# Patient Record
Sex: Female | Born: 1955 | Race: White | Hispanic: No | Marital: Married | Smoking: Current every day smoker
Health system: Southern US, Community
[De-identification: ages and names within clinical notes are randomized; demographics above are authoritative.]

## PROBLEM LIST (undated history)

## (undated) HISTORY — PX: NECK SURGERY: SHX720

## (undated) HISTORY — PX: ABDOMINAL HYSTERECTOMY: SHX81

---

## 2016-04-06 ENCOUNTER — Observation Stay (HOSPITAL_COMMUNITY)
Admission: EM | Admit: 2016-04-06 | Discharge: 2016-04-07 | Disposition: A | Discharge status: Home / Self Care | Payer: Self-pay | Attending: Internal Medicine | Admitting: Internal Medicine

## 2016-04-06 ENCOUNTER — Encounter (HOSPITAL_COMMUNITY): Payer: Self-pay | Admitting: Emergency Medicine

## 2016-04-06 ENCOUNTER — Emergency Department (HOSPITAL_COMMUNITY): Payer: Self-pay

## 2016-04-06 DIAGNOSIS — Z7982 Long term (current) use of aspirin: Secondary | ICD-10-CM | POA: Insufficient documentation

## 2016-04-06 DIAGNOSIS — F17200 Nicotine dependence, unspecified, uncomplicated: Secondary | ICD-10-CM | POA: Insufficient documentation

## 2016-04-06 DIAGNOSIS — R030 Elevated blood-pressure reading, without diagnosis of hypertension: Secondary | ICD-10-CM | POA: Insufficient documentation

## 2016-04-06 DIAGNOSIS — Z8249 Family history of ischemic heart disease and other diseases of the circulatory system: Secondary | ICD-10-CM | POA: Insufficient documentation

## 2016-04-06 DIAGNOSIS — R079 Chest pain, unspecified: Principal | ICD-10-CM | POA: Diagnosis present

## 2016-04-06 LAB — BASIC METABOLIC PANEL
Anion gap: 7 (ref 5–15)
BUN: 16 mg/dL (ref 6–20)
CALCIUM: 10.8 mg/dL — AB (ref 8.9–10.3)
CHLORIDE: 107 mmol/L (ref 101–111)
CO2: 27 mmol/L (ref 22–32)
CREATININE: 0.83 mg/dL (ref 0.44–1.00)
Glucose, Bld: 120 mg/dL — ABNORMAL HIGH (ref 65–99)
Potassium: 3.9 mmol/L (ref 3.5–5.1)
SODIUM: 141 mmol/L (ref 135–145)

## 2016-04-06 LAB — CBC
HCT: 38.2 % (ref 36.0–46.0)
Hemoglobin: 12.4 g/dL (ref 12.0–15.0)
MCH: 30.9 pg (ref 26.0–34.0)
MCHC: 32.5 g/dL (ref 30.0–36.0)
MCV: 95.3 fL (ref 78.0–100.0)
PLATELETS: 286 10*3/uL (ref 150–400)
RBC: 4.01 MIL/uL (ref 3.87–5.11)
RDW: 13.9 % (ref 11.5–15.5)
WBC: 8.1 10*3/uL (ref 4.0–10.5)

## 2016-04-06 LAB — I-STAT TROPONIN, ED: TROPONIN I, POC: 0 ng/mL (ref 0.00–0.08)

## 2016-04-06 MED ORDER — ASPIRIN 81 MG PO CHEW
162.0000 mg | CHEWABLE_TABLET | Freq: Once | ORAL | Status: AC
  Administered 2016-04-07: 162 mg via ORAL
  Filled 2016-04-06: qty 2

## 2016-04-06 MED ORDER — NITROGLYCERIN 0.4 MG SL SUBL
0.4000 mg | SUBLINGUAL_TABLET | SUBLINGUAL | Status: DC | PRN
  Administered 2016-04-07: 0.4 mg via SUBLINGUAL
  Filled 2016-04-06: qty 1

## 2016-04-06 NOTE — ED Triage Notes (Signed)
Pt states she had a sudden onset of chest pain about 20 minutes ago  Pt states the pain started in her left chest and went up to her left shoulder and down into her left arm  Pt states the pain is a steady 7-8 with spikes higher to a 10  Pt states feels light headed  Denies N/V  Pt states she took 2 baby aspirin prior to arrival  Pt states she was watching TV when the pain started

## 2016-04-06 NOTE — ED Provider Notes (Signed)
[facility]-EMERGENCY DEPT Provider Note   CSN: [id number] Arrival date & time: 04/06/16  2208  First Provider Contact:  None    By signing my name below, I, Majel Homer, attest that this documentation has been prepared under the direction and in the presence of Dione Booze, MD . Electronically Signed: Majel Homer, Scribe. 04/07/2016. 12:08 AM.  History   Chief Complaint Chief Complaint  Patient presents with  . Chest Pain   HPI Comments: Amber Hoover is a 60 y.o. female with PMHx of HLD, who presents to the Emergency Department complaining of sudden onset, 5/10, central chest pain that began a few hours PTA. Pt reports her pain radiates down her bilateral arms and left shoulder; she notes she is still experiencing pain in her left shoulder now in the ED. She states her pain lasted for ~30 minutes before spontaneously improving. Pt reports she was sitting down watching tv at the onset of her pain. She states this is the first time she has experienced these symptoms. She notes she took 2 baby aspirin before visiting the ED with mild relief. Pt reports she came to the ED because she was worried by the sudden onset of her pain. She denies shortness of breath, diaphoresis, nausea, vomiting and hx of DM. She reports she smokes ~1 pack a day. Pt notes FHx of heart problems in which her father had 20 MIs at 10 years old and her grandfather died from a MI at 74 y.o.    The history is provided by the patient. No language interpreter was used.   History reviewed. No pertinent past medical history.  There are no active problems to display for this patient.  Past Surgical History:  Procedure Laterality Date  . ABDOMINAL HYSTERECTOMY    . NECK SURGERY      OB History    No data available     Home Medications    Prior to Admission medications   Medication Sig Start Date End Date Taking? Authorizing Provider  aspirin EC 81 MG tablet Take 162 mg by mouth daily as needed (for chest pain).   Yes Historical  Provider, MD  ranitidine (ZANTAC) 150 MG tablet Take 150 mg by mouth 2 (two) times daily as needed for heartburn.   Yes Historical Provider, MD    Family History Family History  Problem Relation Age of Onset  . Diabetes Other   . Cancer Other   . Hypertension Other   . Heart attack Other     Social History Social History  Substance Use Topics  . Smoking status: Current Every Day Smoker  . Smokeless tobacco: Never Used  . Alcohol use No     Comment: former      Allergies   Review of patient's allergies indicates no known allergies.   Review of Systems Review of Systems  Constitutional: Negative for diaphoresis.  Respiratory: Negative for shortness of breath.   Cardiovascular: Positive for chest pain.  Gastrointestinal: Negative for nausea and vomiting.  Musculoskeletal: Positive for arthralgias (left shoulder).   Physical Exam Updated Vital Signs BP 172/94 (BP Location: Left Arm)   Pulse 84   Temp 98 F (36.7 C) (Oral)   Resp 20   SpO2 95%   Physical Exam  Constitutional: She is oriented to person, place, and time. She appears well-developed and well-nourished.  HENT:  Head: Normocephalic and atraumatic.  Eyes: Conjunctivae and EOM are normal. Pupils are equal, round, and reactive to light. Right eye exhibits no discharge. Left eye  exhibits no discharge. No scleral icterus.  Neck: Normal range of motion. Neck supple. No JVD present.  Cardiovascular: Normal rate, regular rhythm and normal heart sounds.   No murmur heard. Pulmonary/Chest: Effort normal and breath sounds normal. She has no wheezes. She has no rales. She exhibits no tenderness.  Abdominal: Soft. Bowel sounds are normal. She exhibits no distension and no mass. There is no tenderness.  Musculoskeletal: Normal range of motion. She exhibits no edema.  Lymphadenopathy:    She has no cervical adenopathy.  Neurological: She is alert and oriented to person, place, and time. No cranial nerve deficit. She  exhibits normal muscle tone. Coordination normal.  Skin: Skin is warm. No rash noted.  Psychiatric: She has a normal mood and affect. Her behavior is normal. Judgment and thought content normal.  Nursing note and vitals reviewed.  ED Treatments / Results  Labs (all labs ordered are listed, but only abnormal results are displayed) Labs Reviewed  BASIC METABOLIC PANEL - Abnormal; Notable for the following:       Result Value   Glucose, Bld 120 (*)    Calcium 10.8 (*)    All other components within normal limits  CBC  I-STAT TROPOININ, ED    EKG  EKG Interpretation  Date/Time:  Wednesday April 06 2016 22:15:51 EDT Ventricular Rate:  84 PR Interval:    QRS Duration: 97 QT Interval:  372 QTC Calculation: 440 R Axis:   8 Text Interpretation:  Sinus rhythm Normal ECG No old tracing to compare Confirmed by [facility]  MD, Aerial Dilley ([id number]) on 04/06/2016 11:48:26 PM      Radiology Dg Chest 2 View  Result Date: 04/06/2016 CLINICAL DATA:  Left-sided chest pain EXAM: CHEST  2 VIEW COMPARISON:  None. FINDINGS: The heart size and mediastinal contours are within normal limits. Both lungs are clear. Granuloma is noted in the left mid lung. The visualized skeletal structures are unremarkable. IMPRESSION: No active cardiopulmonary disease. Electronically Signed   By: Alcide Clever M.D.   On: 04/06/2016 22:46   Procedures Procedures  DIAGNOSTIC STUDIES:  Oxygen Saturation is 95% on RA, normal by my interpretation.    COORDINATION OF CARE:  11:59 PM Discussed treatment plan, which includes aspirin and NTG with pt at bedside and pt agreed to plan.   Medications Ordered in ED Medications  nitroGLYCERIN (NITROSTAT) SL tablet 0.4 mg (0.4 mg Sublingual Given 04/07/16 0113)  aspirin chewable tablet 162 mg (162 mg Oral Given 04/07/16 0013)   Initial Impression / Assessment and Plan / ED Course  I have reviewed the triage vital signs and the nursing notes.  Pertinent labs & imaging results that were  available during my care of the patient were reviewed by me and considered in my medical decision making (see chart for details).  Clinical Course    Chest discomfort which is worrisome for cardiac event. Patient has significant risk factors of hyperlipidemia, tobacco abuse, and strong family history of premature coronary atherosclerosis. Heart score is 4 putting her at moderate risk of major adverse cardiac event. She is given additional aspirin and will be given a trial of nitroglycerin. I anticipate admitting her for serial troponins.  Pain decreased substantially with aspirin, and then resolved completely. Case is discussed with Dr. Toniann Fail of [facility] who agrees to admit the patient under observation status.  Final Clinical Impressions(s) / ED Diagnoses   Final diagnoses:  Chest pain, unspecified chest pain type    New Prescriptions New Prescriptions   No  medications on file   I personally performed the services described in this documentation, which was scribed in my presence. The recorded information has been reviewed and is accurate.     Dione Booze, MD 04/07/16 [phone]

## 2016-04-06 NOTE — ED Notes (Signed)
Pt states she was moving boxes in the sun today for approx 4 hours. She also reports having 3 double shot starbuck energy drinks.

## 2016-04-07 ENCOUNTER — Observation Stay (HOSPITAL_BASED_OUTPATIENT_CLINIC_OR_DEPARTMENT_OTHER): Payer: Self-pay

## 2016-04-07 ENCOUNTER — Other Ambulatory Visit: Payer: Self-pay | Admitting: Cardiology

## 2016-04-07 ENCOUNTER — Encounter (HOSPITAL_COMMUNITY): Payer: Self-pay | Admitting: Internal Medicine

## 2016-04-07 DIAGNOSIS — R079 Chest pain, unspecified: Secondary | ICD-10-CM

## 2016-04-07 DIAGNOSIS — F17210 Nicotine dependence, cigarettes, uncomplicated: Secondary | ICD-10-CM

## 2016-04-07 DIAGNOSIS — R0789 Other chest pain: Secondary | ICD-10-CM

## 2016-04-07 DIAGNOSIS — Z720 Tobacco use: Secondary | ICD-10-CM

## 2016-04-07 DIAGNOSIS — R072 Precordial pain: Secondary | ICD-10-CM

## 2016-04-07 DIAGNOSIS — Z8249 Family history of ischemic heart disease and other diseases of the circulatory system: Secondary | ICD-10-CM

## 2016-04-07 LAB — LIPID PANEL
CHOL/HDL RATIO: 4.7 ratio
Cholesterol: 246 mg/dL — ABNORMAL HIGH (ref 0–200)
HDL: 52 mg/dL (ref >40)
LDL CALC: 162 mg/dL — AB (ref 0–99)
Triglycerides: 159 mg/dL — ABNORMAL HIGH (ref <150)
VLDL: 32 mg/dL (ref 0–40)

## 2016-04-07 LAB — CBC
HCT: 36.5 % (ref 36.0–46.0)
Hemoglobin: 12 g/dL (ref 12.0–15.0)
MCH: 31.4 pg (ref 26.0–34.0)
MCHC: 32.9 g/dL (ref 30.0–36.0)
MCV: 95.5 fL (ref 78.0–100.0)
Platelets: 266 10*3/uL (ref 150–400)
RBC: 3.82 MIL/uL — ABNORMAL LOW (ref 3.87–5.11)
RDW: 14.1 % (ref 11.5–15.5)
WBC: 6.9 10*3/uL (ref 4.0–10.5)

## 2016-04-07 LAB — CREATININE, SERUM
Creatinine, Ser: 0.7 mg/dL (ref 0.44–1.00)
GFR calc non Af Amer: >60 mL/min (ref >60)

## 2016-04-07 LAB — TROPONIN I
Troponin I: <0.03 ng/mL (ref <0.03)
Troponin I: <0.03 ng/mL (ref <0.03)

## 2016-04-07 LAB — ECHOCARDIOGRAM COMPLETE
Height: 62 in
WEIGHTICAEL: 2123.47 [oz_av]

## 2016-04-07 MED ORDER — ACETAMINOPHEN 325 MG PO TABS: 650.0000 mg | ORAL_TABLET | ORAL | Status: DC | PRN

## 2016-04-07 MED ORDER — ONDANSETRON HCL 4 MG/2ML IJ SOLN: 4.0000 mg | Freq: Four times a day (QID) | INTRAMUSCULAR | Status: DC | PRN

## 2016-04-07 MED ORDER — CHLORHEXIDINE GLUCONATE 0.12 % MT SOLN
15.0000 mL | Freq: Two times a day (BID) | OROMUCOSAL | Status: DC
  Filled 2016-04-07: qty 15

## 2016-04-07 MED ORDER — ENOXAPARIN SODIUM 40 MG/0.4ML ~~LOC~~ SOLN
40.0000 mg | SUBCUTANEOUS | Status: DC
  Filled 2016-04-07: qty 0.4

## 2016-04-07 MED ORDER — ASPIRIN EC 325 MG PO TBEC
325.0000 mg | DELAYED_RELEASE_TABLET | Freq: Every day | ORAL | Status: DC
  Administered 2016-04-07: 325 mg via ORAL
  Filled 2016-04-07: qty 1

## 2016-04-07 MED ORDER — CETYLPYRIDINIUM CHLORIDE 0.05 % MT LIQD: 7.0000 mL | Freq: Two times a day (BID) | OROMUCOSAL | Status: DC

## 2016-04-07 MED ORDER — FAMOTIDINE 20 MG PO TABS
20.0000 mg | ORAL_TABLET | Freq: Two times a day (BID) | ORAL | Status: DC
  Administered 2016-04-07: 20 mg via ORAL
  Filled 2016-04-07: qty 1

## 2016-04-07 MED ORDER — ASPIRIN 325 MG PO TBEC: 325.0000 mg | DELAYED_RELEASE_TABLET | Freq: Every day | ORAL | 0 refills | Status: AC

## 2016-04-07 NOTE — H&P (Addendum)
History and Physical    Amber Hoover YSA:[id number] DOB: 1956-07-20 DOA: 04/06/2016  PCP: No primary care provider on file.  Patient coming from: Home.  Chief Complaint: Chest pain.  HPI: Amber Hoover is a 60 y.o. female with tobacco abuse presents to the ER because of chest pain. Patient started developing chest pain last night around 9:30 PM while watching TV. Pain was retrosternal, pressure-like radiating to her left shoulder. Patient took some aspirin and patient received sublingual nitroglycerin in the ER following patient's chest pain has resolved. Cardiac markers and EKG are unremarkable. Patient has strong family history of coronary artery disease with patient's dad having MI in the 30s. Patient is being admitted for further observation and management of chest pain. Denies any nausea vomiting abdominal pain productive cough or fever chills. 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  ED Course: See history of presenting illness.  Review of Systems: As per HPI, rest all negative.   History reviewed. No pertinent past medical history.  Past Surgical History:  Procedure Laterality Date  . ABDOMINAL HYSTERECTOMY    . NECK SURGERY       reports that she has been smoking.  She has never used smokeless tobacco. She reports that she does not drink alcohol or use drugs.  No Known Allergies  Family History  Problem Relation Age of Onset  . Diabetes Other   . Cancer Other   . Hypertension Other   . Heart attack Other   . CAD Father     Prior to Admission medications   Medication Sig Start Date End Date Taking? Authorizing Provider  aspirin EC 81 MG tablet Take 162 mg by mouth daily as needed (for chest pain).   Yes Historical Provider, MD  ranitidine (ZANTAC) 150 MG tablet Take 150 mg by mouth 2 (two) times daily as needed for heartburn.   Yes Historical Provider, MD    Physical Exam: Vitals:   04/07/16 0000 04/07/16 0100  04/07/16 0111 04/07/16 0120  BP: 127/74 103/55 119/80 105/80  Pulse: 70 73 70 77  Resp: 23 17 24 16   Temp:      TempSrc:      SpO2: 97% 96% 96% 95%      Constitutional: Not in distress. Vitals:   04/07/16 0000 04/07/16 0100 04/07/16 0111 04/07/16 0120  BP: 127/74 103/55 119/80 105/80  Pulse: 70 73 70 77  Resp: 23 17 24 16   Temp:      TempSrc:      SpO2: 97% 96% 96% 95%   Eyes: Anicteric no pallor. ENMT: No discharge from the ears eyes nose and mouth. Neck: No mass felt. No JVD appreciated. Respiratory: No rhonchi or crepitations. Cardiovascular: S1 and S2 heard. Abdomen: Soft nontender bowel sounds present. Musculoskeletal: No edema. Skin: No rash. Neurologic: Alert awake oriented to time place and person. Moves all extremities. Psychiatric: Appears normal.   Labs on Admission: I have personally reviewed following labs and imaging studies  CBC:  Recent Labs Lab 04/06/16 2234  WBC 8.1  HGB 12.4  HCT 38.2  MCV 95.3  PLT 286   Basic Metabolic Panel:  Recent Labs Lab 04/06/16 2234  NA 141  K 3.9  CL 107  CO2 27  GLUCOSE 120*  BUN 16  CREATININE 0.83  CALCIUM 10.8*   GFR: CrCl cannot be calculated (Unknown ideal weight.). Liver Function Tests: No results for input(s): AST, ALT, ALKPHOS, BILITOT, PROT, ALBUMIN in  the last 168 hours. No results for input(s): LIPASE, AMYLASE in the last 168 hours. No results for input(s): AMMONIA in the last 168 hours. Coagulation Profile: No results for input(s): INR, PROTIME in the last 168 hours. Cardiac Enzymes: No results for input(s): CKTOTAL, CKMB, CKMBINDEX, TROPONINI in the last 168 hours. BNP (last 3 results) No results for input(s): PROBNP in the last 8760 hours. HbA1C: No results for input(s): HGBA1C in the last 72 hours. CBG: No results for input(s): GLUCAP in the last 168 hours. Lipid Profile: No results for input(s): CHOL, HDL, LDLCALC, TRIG, CHOLHDL, LDLDIRECT in the last 72 hours. Thyroid Function  Tests: No results for input(s): TSH, T4TOTAL, FREET4, T3FREE, THYROIDAB in the last 72 hours. Anemia Panel: No results for input(s): VITAMINB12, FOLATE, FERRITIN, TIBC, IRON, RETICCTPCT in the last 72 hours. Urine analysis: No results found for: COLORURINE, APPEARANCEUR, LABSPEC, PHURINE, GLUCOSEU, HGBUR, BILIRUBINUR, KETONESUR, PROTEINUR, UROBILINOGEN, NITRITE, LEUKOCYTESUR Sepsis Labs: (procalcitonin:4,lacticidven:4) )No results found for this or any previous visit (from the past 240 hour(s)).   Radiological Exams on Admission: Dg Chest 2 View  Result Date: 04/06/2016 CLINICAL DATA:  Left-sided chest pain EXAM: CHEST  2 VIEW COMPARISON:  None. FINDINGS: The heart size and mediastinal contours are within normal limits. Both lungs are clear. Granuloma is noted in the left mid lung. The visualized skeletal structures are unremarkable. IMPRESSION: No active cardiopulmonary disease. Electronically Signed   By: Alcide Clever M.D.   On: 04/06/2016 22:46   EKG: Independently reviewed. Normal sinus rhythm.  Assessment/Plan Principal Problem:   Pain in the chest    1. Chest pain - given the strong family history and ongoing tobacco abuse we will cycle cardiac markers to rule out ACS and keep patient nothing by mouth in a.m. in anticipation of possible cardiac procedures. When necessary nitroglycerin and aspirin. Check 2-D echo. 2. Elevated blood pressure - patient's blood pressure was elevated on arrival. Closely follow blood pressure trends. 3. Mild hypercalcemia - recheck metabolic panel in a.m. 4. Tobacco abuse - tobacco cessation counseling requested.   DVT prophylaxis: Lovenox. Code Status: Full code.  Family Communication: Discussed with patient.  Disposition Plan: Home.  Consults called: None.  Admission status: Observation. Telemetry.    Eduard Clos MD [facility] Pager [phone].  If 7PM-7AM, please contact night-coverage [email or web address] Password  [facility]  04/07/2016, 2:41 AM

## 2016-04-07 NOTE — Consult Note (Signed)
.    Patient ID: Amber Hoover MRN: [id number], DOB/AGE: Dec 19, 1955   Admit date: 04/06/2016   Reason for Consult: Chest Pain Requesting MD: Dr. Toniann Fail, Internal Medicine   Primary Physician: No primary care provider on file. Primary Cardiologist: New (Dr. Rennis Golden)  Pt. Profile:  60 y/o female smoker with a strong family h/o premature CAD, admitted for CP evaluation.   Problem List  History reviewed. No pertinent past medical history.  Past Surgical History:  Procedure Laterality Date  . ABDOMINAL HYSTERECTOMY    . NECK SURGERY       Allergies  No Known Allergies  HPI  60 y/o female smoker with a strong family h/o premature CAD, admitted for CP evaluation. Her father had CAD and had his first MI in his 7s. He is deceased. She has been a smoker for over 40 years.  She smokes, on average, one pack per day. She denies any personal h/o DM, HTN or HLD. She does not take any prescription medications. 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 This requires a great deal of heavy lifting for her. She denies any exertional chest pain or dyspnea with work-related activities.   She reports she was in her usual state of health until last p.m. around 9:30. She developed resting left-sided chest discomfort radiating to her left shoulder and down her left arm. This was described as sharp pain. No associated dyspnea, diaphoresis, nausea or vomiting. However she does report that she felt lightheaded. No syncopal/near syncope. This occurred about an hour and half after eating dinner. She had a hot dog w/ chili. No associated belching. No exacerbating or alleviating factors. She does report some mild improvement with aspirin. She  otes that she did a moderate amount of heavy lifting while at work yesterday. Her pain lasted for about 45 minutes without resolving, prompting her to report to the emergency department. She was given sublingual nitroglycerin and  her pain resolved. She denies any recurrent chest pain since that initial episode.    She has been admitted by internal medicine.  Initial Troponin is negative. 2nd and 3rd troponins pending. EKG shows NSR. Non ischemic. No prior EKGs for comparison. CXR unremarkable. VSS.  She is currently chest pain-free.   Home Medications  Prior to Admission medications   Medication Sig Start Date End Date Taking? Authorizing Provider  aspirin EC 81 MG tablet Take 162 mg by mouth daily as needed (for chest pain).   Yes Historical Provider, MD  ranitidine (ZANTAC) 150 MG tablet Take 150 mg by mouth 2 (two) times daily as needed for heartburn.   Yes Historical Provider, MD   Hospital Meds . antiseptic oral rinse  7 mL Mouth Rinse q12n4p  . aspirin EC  325 mg Oral Daily  . chlorhexidine  15 mL Mouth Rinse BID  . enoxaparin (LOVENOX) injection  40 mg Subcutaneous Q24H  . famotidine  20 mg Oral BID   PRN Meds acetaminophen, nitroGLYCERIN, ondansetron (ZOFRAN) IV  Family History  Family History  Problem Relation Age of Onset  . Diabetes Other   . Cancer Other   . Hypertension Other   . Heart attack Other   . CAD Father     Social History  Social History   Social History  . Marital status: Married    Spouse name: N/A  . Number of children: N/A  . Years of education: N/A   Occupational History  . Not on file.  Social History Main Topics  . Smoking status: Current Every Day Smoker  . Smokeless tobacco: Never Used  . Alcohol use No     Comment: former   . Drug use: No  . Sexual activity: Not on file   Other Topics Concern  . Not on file   Social History Narrative  . No narrative on file     Review of Systems General:  No chills, fever, night sweats or weight changes.  Cardiovascular:  +chest pain, no dyspnea on exertion, edema, orthopnea, palpitations, paroxysmal nocturnal dyspnea. Dermatological: No rash, lesions/masses Respiratory: No cough, dyspnea Urologic: No  hematuria, dysuria Abdominal:   No nausea, vomiting, diarrhea, bright red blood per rectum, melena, or hematemesis Neurologic:  No visual changes, wkns, changes in mental status. All other systems reviewed and are otherwise negative except as noted above.  Physical Exam  Blood pressure 120/71, pulse 68, temperature 97.7 F (36.5 C), temperature source Oral, resp. rate 18, height 5\' 2"  (1.575 m), weight 132 lb 11.5 oz (60.2 kg), SpO2 97 %.  General: Pleasant, NAD Psych: Normal affect. Neuro: Alert and oriented X 3. Moves all extremities spontaneously. HEENT: Normal  Neck: Supple without bruits or JVD. Lungs:  Resp regular and unlabored, CTA. Heart: RRR no s3, s4, or murmurs. Abdomen: Soft, non-tender, non-distended, BS + x 4.  Extremities: No clubbing, cyanosis or edema. DP/PT/Radials 2+ and equal bilaterally.  Labs  Troponin [facility] of Care Test)  Recent Labs  04/06/16 2239  TROPIPOC 0.00    Recent Labs  04/07/16 0509  TROPONINI <0.03   Lab Results  Component Value Date   WBC 6.9 04/07/2016   HGB 12.0 04/07/2016   HCT 36.5 04/07/2016   MCV 95.5 04/07/2016   PLT 266 04/07/2016    Recent Labs Lab 04/06/16 2234 04/07/16 0509  NA 141  --   K 3.9  --   CL 107  --   CO2 27  --   BUN 16  --   CREATININE 0.83 0.70  CALCIUM 10.8*  --   GLUCOSE 120*  --    No results found for: CHOL, HDL, LDLCALC, TRIG No results found for: DDIMER   Radiology/Studies  Dg Chest 2 View  Result Date: 04/06/2016 CLINICAL DATA:  Left-sided chest pain EXAM: CHEST  2 VIEW COMPARISON:  None. FINDINGS: The heart size and mediastinal contours are within normal limits. Both lungs are clear. Granuloma is noted in the left mid lung. The visualized skeletal structures are unremarkable. IMPRESSION: No active cardiopulmonary disease. Electronically Signed   By: Alcide Clever M.D.   On: 04/06/2016 22:46   ECG  EKG shows NSR. Non ischemic. No prior EKGs for comparison.  Echocardiogram - pending    ASSESSMENT AND PLAN  Principal Problem:   Pain in the chest  1. Chest pain: Recent chest pain was a mixture of both typical plus atypical features. She is currently chest pain-free. Physical exam is unremarkable. EKG shows normal sinus rhythm with no ischemic abnormalities. Initial troponin is negative. Second and third troponins are pending. 2-D echocardiogram pending. Chest x-ray is unremarkable. Her cardiac risk factors include a strong family history of premature CAD (father had multiple MIs, the earliest in his 33s) also with a 40 year history of tobacco use. Given her recent symptoms and cardiac risk factors ,recommend further ischemic workup to rule out underlying CAD. If second troponin is normal, would risk stratify with a nuclear stress test. I discussed this possibility with the patient and she is  a bit reluctant. If patient is not agreeable to do stress test during this admission, would recommend outpatient nuclear stress test in the future, if she rules out for MI. Risk factor modification including smoking cessation strongly advised. Consider checking a fasting lipid panel and hemoglobin A1c to screen for hyperlipidemia and diabetes.   Signed, Robbie Lis, PA-C 04/07/2016, 8:43 AM

## 2016-04-07 NOTE — Discharge Summary (Signed)
Physician Discharge Summary  Amber Hoover NUU:[id number] DOB: 07/01/1956 DOA: 04/06/2016  PCP: No primary care provider on file.  Admit date: 04/06/2016 Discharge date: 04/07/2016   Recommendations for Outpatient Follow-Up:   1. Stop smoking 2. Outpatient stress test 3. FLP and HgbA1C pending at d/c   Discharge Diagnosis:   Principal Problem:   Pain in the chest   Discharge disposition:  Home.    Discharge Condition: Improved.  Diet recommendation: Low sodium, heart healthy.  Wound care: None.   History of Present Illness:   Amber Hoover is a 60 y.o. female with tobacco abuse presents to the ER because of chest pain. Patient started developing chest pain last night around 9:30 PM while watching TV. Pain was retrosternal, pressure-like radiating to her left shoulder. Patient took some aspirin and patient received sublingual nitroglycerin in the ER following patient's chest pain has resolved. Cardiac markers and EKG are unremarkable. Patient has strong family history of coronary artery disease with patient's dad having MI in the 30s. Patient is being admitted for further observation and management of chest pain. Denies any nausea vomiting abdominal pain productive cough or fever chills. 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   Hospital Course by Problem:   Chest pain: Troponin has been negative x 3. Other labs unremarkable. Echo briefly reviewed at bedside, normal systolic function and mild diastolic dysfunction without wall motion abnormalities. Would recommend outpatient stress testing  tobacco abuse -encourage cessation    Medical Consultants:    cards   Discharge Exam:   Vitals:   04/07/16 0300 04/07/16 0340  BP: 106/73 120/71  Pulse: 70 68  Resp: 15 18  Temp:  97.7 F (36.5 C)   Vitals:   04/07/16 0120 04/07/16 0256 04/07/16 0300 04/07/16 0340  BP: 105/80 107/64 106/73 120/71  Pulse: 77 62 70 68    Resp: 16 18 15 18   Temp:  97.9 F (36.6 C)  97.7 F (36.5 C)  TempSrc:  Oral  Oral  SpO2: 95% 98% 95% 97%  Weight:    60.2 kg (132 lb 11.5 oz)  Height:    5\' 2"  (1.575 m)    Gen:  NAD    The results of significant diagnostics from this hospitalization (including imaging, microbiology, ancillary and laboratory) are listed below for reference.     Procedures and Diagnostic Studies:   Dg Chest 2 View  Result Date: 04/06/2016 CLINICAL DATA:  Left-sided chest pain EXAM: CHEST  2 VIEW COMPARISON:  None. FINDINGS: The heart size and mediastinal contours are within normal limits. Both lungs are clear. Granuloma is noted in the left mid lung. The visualized skeletal structures are unremarkable. IMPRESSION: No active cardiopulmonary disease. Electronically Signed   By: Alcide Clever M.D.   On: 04/06/2016 22:46    Labs:   Basic Metabolic Panel:  Recent Labs Lab 04/06/16 2234 04/07/16 0509  NA 141  --   K 3.9  --   CL 107  --   CO2 27  --   GLUCOSE 120*  --   BUN 16  --   CREATININE 0.83 0.70  CALCIUM 10.8*  --    GFR Estimated Creatinine Clearance: 64.7 mL/min (by C-G formula based on SCr of 0.8 mg/dL). Liver Function Tests: No results for input(s): AST, ALT, ALKPHOS, BILITOT, PROT, ALBUMIN in the last 168 hours. No results for input(s): LIPASE, AMYLASE in the last 168 hours. No results for input(s): AMMONIA  in the last 168 hours. Coagulation profile No results for input(s): INR, PROTIME in the last 168 hours.  CBC:  Recent Labs Lab 04/06/16 2234 04/07/16 0509  WBC 8.1 6.9  HGB 12.4 12.0  HCT 38.2 36.5  MCV 95.3 95.5  PLT 286 266   Cardiac Enzymes:  Recent Labs Lab 04/07/16 0509 04/07/16 1107  TROPONINI <0.03 <0.03   BNP: Invalid input(s): POCBNP CBG: No results for input(s): GLUCAP in the last 168 hours. D-Dimer No results for input(s): DDIMER in the last 72 hours. Hgb A1c No results for input(s): HGBA1C in the last 72 hours. Lipid Profile No  results for input(s): CHOL, HDL, LDLCALC, TRIG, CHOLHDL, LDLDIRECT in the last 72 hours. Thyroid function studies No results for input(s): TSH, T4TOTAL, T3FREE, THYROIDAB in the last 72 hours.  Invalid input(s): FREET3 Anemia work up No results for input(s): VITAMINB12, FOLATE, FERRITIN, TIBC, IRON, RETICCTPCT in the last 72 hours. Microbiology No results found for this or any previous visit (from the past 240 hour(s)).   Discharge Instructions:   Discharge Instructions    Diet - low sodium heart healthy    Complete by:  As directed   Discharge instructions    Complete by:  As directed   Outpatient stress test Stop smoking   Increase activity slowly    Complete by:  As directed       Medication List    TAKE these medications   aspirin 325 MG EC tablet Take 1 tablet (325 mg total) by mouth daily. What changed:  medication strength  how much to take  when to take this  reasons to take this   ranitidine 150 MG tablet Commonly known as:  ZANTAC Take 150 mg by mouth 2 (two) times daily as needed for heartburn.         Time coordinating discharge: 35 min Signed:  Linet Brash U Rutilio Yellowhair   [facility] 04/07/2016, 12:02 PM

## 2016-04-07 NOTE — Progress Notes (Signed)
  Echocardiogram 2D Echocardiogram has been performed.  Arvil Chaco 04/07/2016, 10:36 AM

## 2016-04-08 LAB — HEMOGLOBIN A1C
Hgb A1c MFr Bld: 5.3 % (ref 4.8–5.6)
MEAN PLASMA GLUCOSE: 105 mg/dL

## 2017-07-13 ENCOUNTER — Other Ambulatory Visit: Payer: Self-pay | Admitting: Obstetrics and Gynecology

## 2017-07-13 DIAGNOSIS — N644 Mastodynia: Secondary | ICD-10-CM

## 2017-07-17 ENCOUNTER — Ambulatory Visit
Admission: RE | Admit: 2017-07-17 | Discharge: 2017-07-17 | Disposition: A | Discharge status: Home / Self Care | Payer: BLUE CROSS/BLUE SHIELD | Source: Ambulatory Visit | Attending: Obstetrics and Gynecology | Admitting: Obstetrics and Gynecology

## 2017-07-17 DIAGNOSIS — N644 Mastodynia: Secondary | ICD-10-CM

## 2018-12-29 IMAGING — MG 2D DIGITAL DIAGNOSTIC BILATERAL MAMMOGRAM WITH CAD AND ADJUNCT T
8 series · 8 of 24 positions shown · non-contrast
Comparison: None

CLINICAL DATA: 61-year-old female with focal pain in the
retroareolar and inner right breast.

EXAM:
2D DIGITAL DIAGNOSTIC BILATERAL MAMMOGRAM WITH CAD AND ADJUNCT TOMO
ULTRASOUND RIGHT BREAST

[L MLO synth-2D]
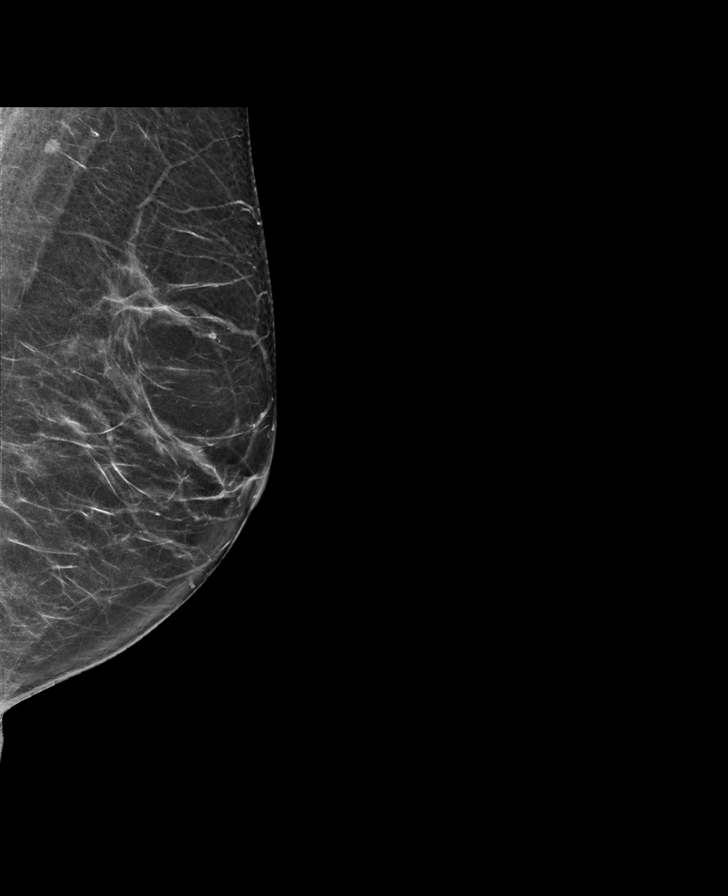

[R CC synth-2D]
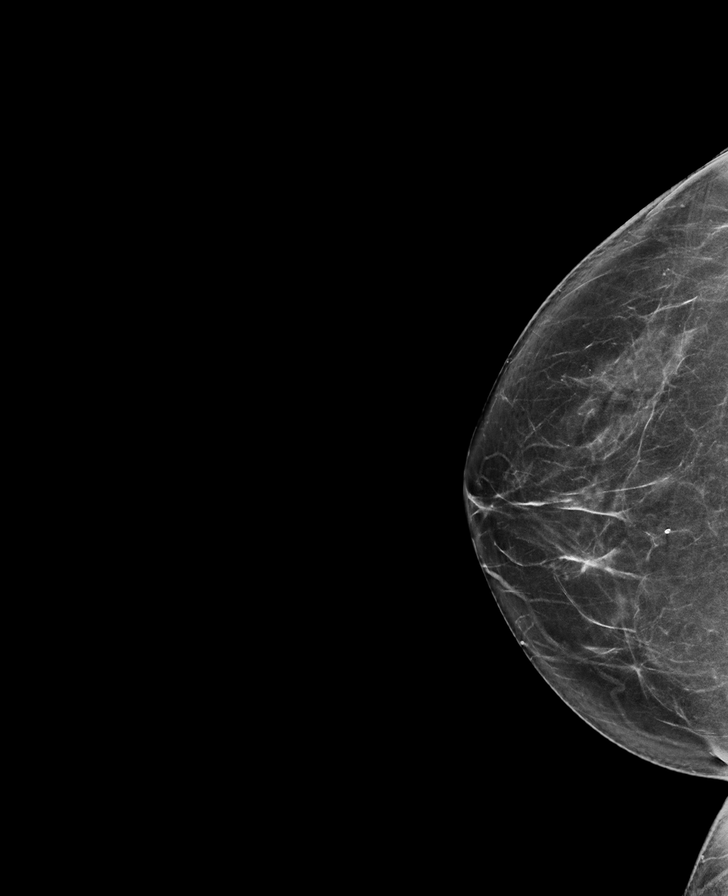

[L CC synth-2D]
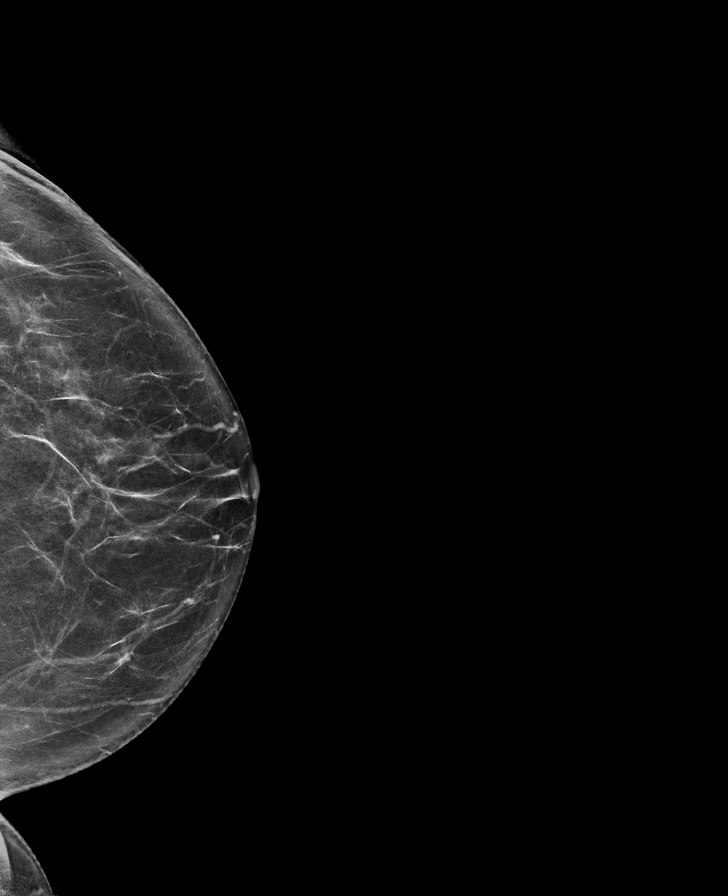

[R MLO synth-2D]
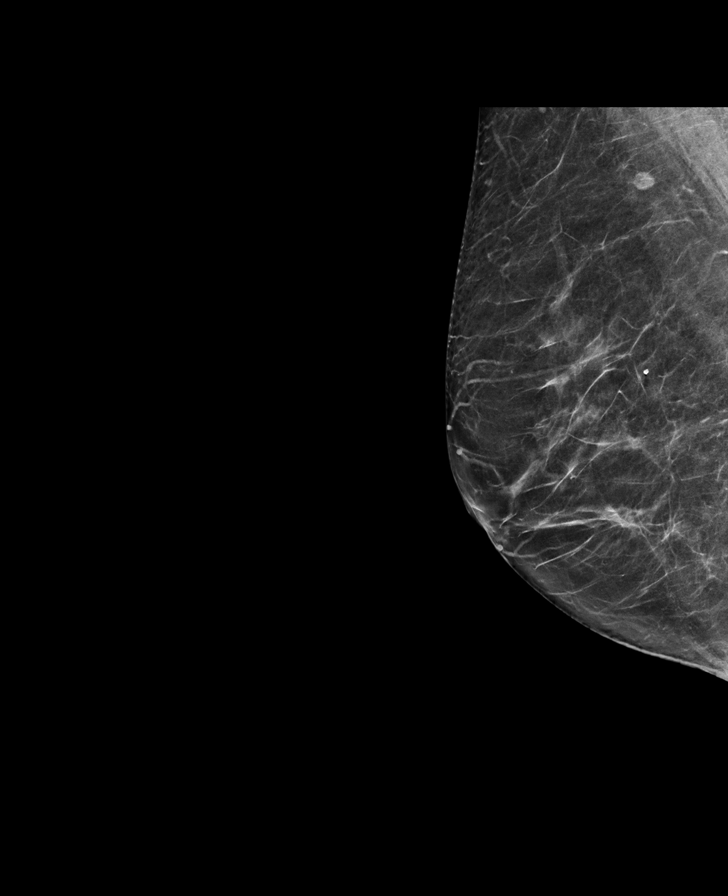

[R CC tomo · tomo slice 39/77.0]
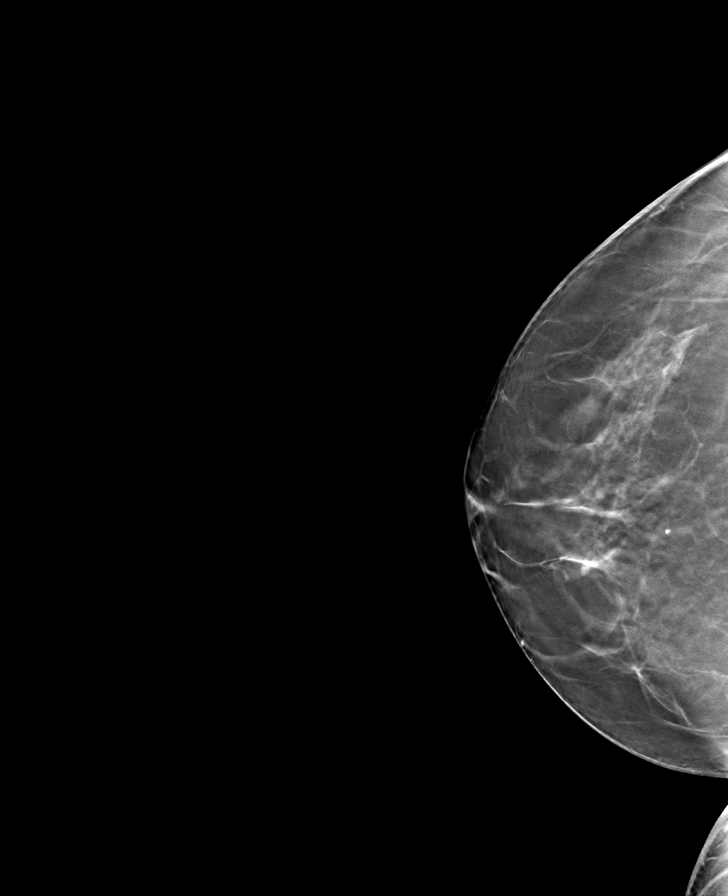

[R MLO tomo · tomo slice 35/69.0]
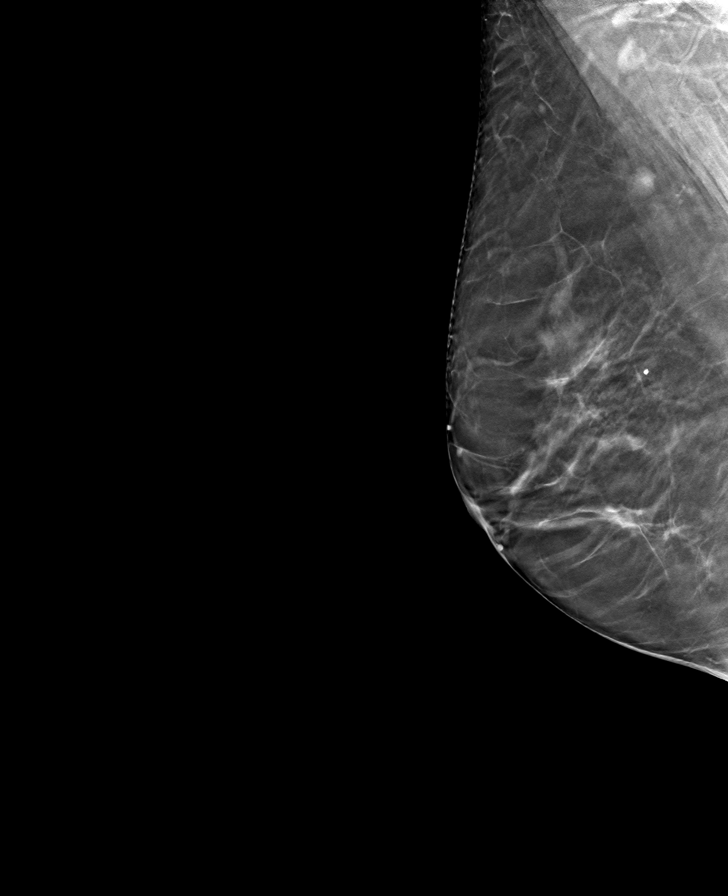

[L CC tomo · tomo slice 37/74.0]
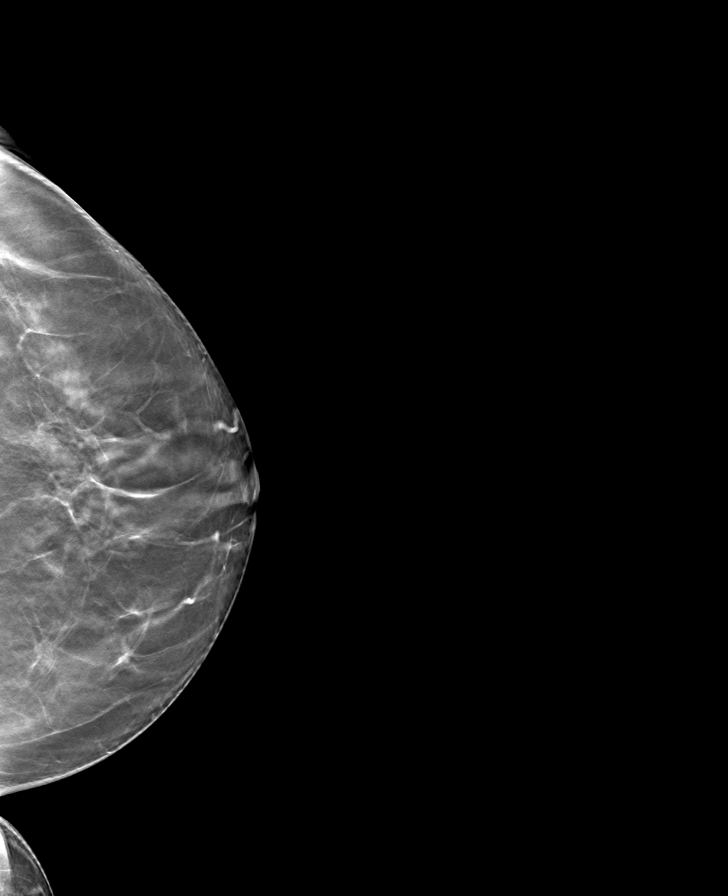

[L MLO tomo · tomo slice 36/71.0]
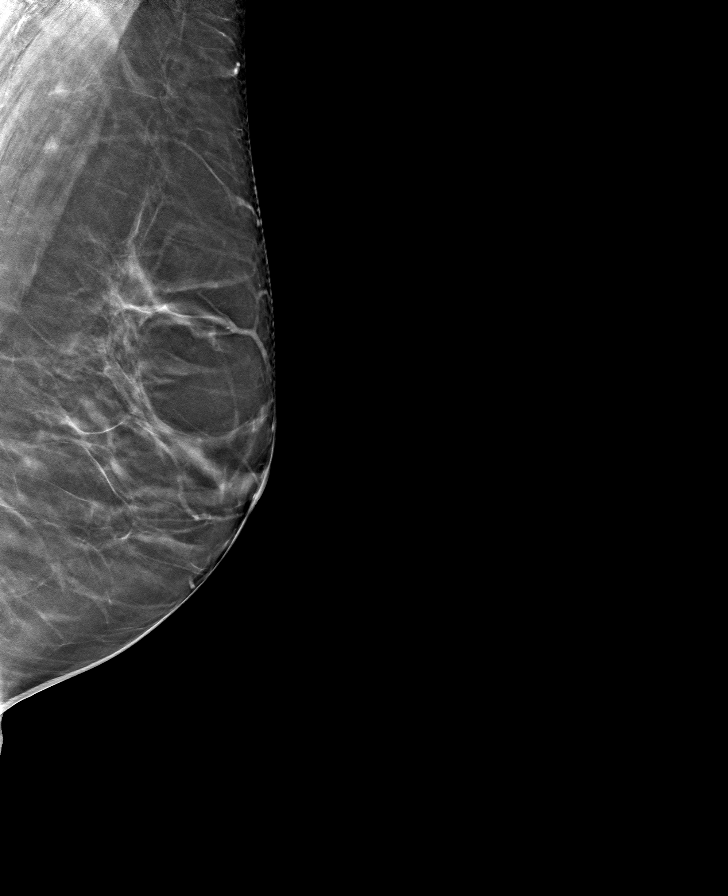

[8 of 24 positions shown; findings below may reference images not displayed]

ACR Breast Density Category b: There are scattered areas of
fibroglandular density.
FINDINGS: 2D and 3D full field views of both breast demonstrate no suspicious
mass, distortion or worrisome calcifications.

Mammographic images were processed with CAD.

Targeted ultrasound is performed, showing no solid or cystic mass,
distortion or abnormal shadowing within the retroareolar or inner
right breast.
IMPRESSION: No mammographic or sonographic abnormality in the area of patient's
right breast pain.

No mammographic evidence of breast malignancy bilaterally.

RECOMMENDATION:
Bilateral screening mammograms in 1 year.

I have discussed the findings and recommendations with the patient.
Results were also provided in writing at the conclusion of the
visit. If applicable, a reminder letter will be sent to the patient
regarding the next appointment.

BI-RADS CATEGORY  1: Negative.

## 2023-03-16 ENCOUNTER — Emergency Department (HOSPITAL_BASED_OUTPATIENT_CLINIC_OR_DEPARTMENT_OTHER)
Admission: EM | Admit: 2023-03-16 | Discharge: 2023-03-16 | Disposition: A | Discharge status: Home / Self Care | Payer: BC Managed Care – PPO | Attending: Emergency Medicine | Admitting: Emergency Medicine

## 2023-03-16 ENCOUNTER — Emergency Department (HOSPITAL_BASED_OUTPATIENT_CLINIC_OR_DEPARTMENT_OTHER): Payer: BC Managed Care – PPO | Admitting: Radiology

## 2023-03-16 ENCOUNTER — Other Ambulatory Visit: Payer: Self-pay

## 2023-03-16 ENCOUNTER — Encounter (HOSPITAL_BASED_OUTPATIENT_CLINIC_OR_DEPARTMENT_OTHER): Payer: Self-pay

## 2023-03-16 DIAGNOSIS — R0789 Other chest pain: Secondary | ICD-10-CM | POA: Diagnosis present

## 2023-03-16 DIAGNOSIS — R519 Headache, unspecified: Secondary | ICD-10-CM | POA: Insufficient documentation

## 2023-03-16 DIAGNOSIS — R202 Paresthesia of skin: Secondary | ICD-10-CM | POA: Insufficient documentation

## 2023-03-16 DIAGNOSIS — M79622 Pain in left upper arm: Secondary | ICD-10-CM | POA: Diagnosis not present

## 2023-03-16 DIAGNOSIS — Z7982 Long term (current) use of aspirin: Secondary | ICD-10-CM | POA: Diagnosis not present

## 2023-03-16 DIAGNOSIS — R42 Dizziness and giddiness: Secondary | ICD-10-CM | POA: Insufficient documentation

## 2023-03-16 LAB — TROPONIN I (HIGH SENSITIVITY)
Troponin I (High Sensitivity): <2 ng/L (ref <18)
Troponin I (High Sensitivity): <2 ng/L (ref <18)

## 2023-03-16 LAB — BASIC METABOLIC PANEL
Anion gap: 8 (ref 5–15)
BUN: 11 mg/dL (ref 8–23)
CO2: 26 mmol/L (ref 22–32)
Calcium: 10.7 mg/dL — ABNORMAL HIGH (ref 8.9–10.3)
Chloride: 105 mmol/L (ref 98–111)
Creatinine, Ser: 0.67 mg/dL (ref 0.44–1.00)
GFR, Estimated: >60 mL/min (ref >60)
Glucose, Bld: 97 mg/dL (ref 70–99)
Potassium: 3.8 mmol/L (ref 3.5–5.1)
Sodium: 139 mmol/L (ref 135–145)

## 2023-03-16 LAB — CBC
HCT: 34.7 % — ABNORMAL LOW (ref 36.0–46.0)
Hemoglobin: 11.1 g/dL — ABNORMAL LOW (ref 12.0–15.0)
MCH: 31.8 pg (ref 26.0–34.0)
MCHC: 32 g/dL (ref 30.0–36.0)
MCV: 99.4 fL (ref 80.0–100.0)
Platelets: 312 10*3/uL (ref 150–400)
RBC: 3.49 MIL/uL — ABNORMAL LOW (ref 3.87–5.11)
RDW: 15.8 % — ABNORMAL HIGH (ref 11.5–15.5)
WBC: 6.5 10*3/uL (ref 4.0–10.5)
nRBC: 0 % (ref 0.0–0.2)

## 2023-03-16 MED ORDER — NITROGLYCERIN 0.4 MG SL SUBL: 0.4000 mg | SUBLINGUAL_TABLET | Freq: Once | SUBLINGUAL | Status: DC

## 2023-03-16 NOTE — ED Provider Notes (Signed)
[place] EMERGENCY DEPARTMENT AT [facility] Provider Note   CSN: [id number] Arrival date & time: 03/16/23  4403     History  Chief Complaint  Patient presents with   Chest Pain   Numbness    Liridona Bultemeier is a 67 y.o. female.  HPI   67 year old female who is an everyday smoker presents emergency department with midsternal chest heaviness.  Patient states last night around 9 PM when she went to bed she felt some mild chest heaviness.  She took a baby aspirin and went to sleep.  When she woke up this morning around 5 AM at her typical time she had the same ongoing mild chest heaviness.  While she was loading laundry she felt slightly lightheaded.  When she went on her daily walks she started getting left upper extremity sharp pain with some tingling of her left fingers as well as a soreness in the left side of her face/jaw.  Denies any shortness of breath, cough/hemoptysis.  No swelling of her lower extremities.  No recent fever or illness.  No new medications.  No known cardiac history.  There is no sharp pain that radiates to the back, abdomen or flank.  She took full dose aspirin this morning and one of her husbands nitro tabs at home that improved her symptoms.  Rates the discomfort a 2/10 currently.  Home Medications Prior to Admission medications   Medication Sig Start Date End Date Taking? Authorizing Provider  aspirin EC 325 MG EC tablet Take 1 tablet (325 mg total) by mouth daily. 04/07/16   Joseph Art, DO  ranitidine (ZANTAC) 150 MG tablet Take 150 mg by mouth 2 (two) times daily as needed for heartburn.    [provider]      Allergies    Patient has no known allergies.    Review of Systems   Review of Systems  Constitutional:  Positive for chills and fatigue. Negative for fever.  Respiratory:  Positive for chest tightness. Negative for shortness of breath.   Cardiovascular:  Positive for chest pain. Negative for leg swelling.  Gastrointestinal:   Negative for abdominal pain, diarrhea and vomiting.  Musculoskeletal:  Negative for back pain.       + left arm and jaw pain  Skin:  Negative for rash.  Neurological:  Negative for dizziness, facial asymmetry, speech difficulty, weakness and headaches.    Physical Exam Updated Vital Signs BP (!) 143/73   Pulse 63   Temp 98.1 F (36.7 C) (Temporal)   Resp 17   Ht 5\' 2"  (1.575 m)   Wt 52.2 kg   SpO2 93%   BMI 21.03 kg/m  Physical Exam Vitals and nursing note reviewed.  Constitutional:      General: She is not in acute distress.    Appearance: Normal appearance.  HENT:     Head: Normocephalic.     Mouth/Throat:     Mouth: Mucous membranes are moist.  Cardiovascular:     Rate and Rhythm: Normal rate.  Pulmonary:     Effort: Pulmonary effort is normal. No respiratory distress.  Abdominal:     Palpations: Abdomen is soft.     Tenderness: There is no abdominal tenderness.  Musculoskeletal:     Comments: Equal palpable radial pulses  Skin:    General: Skin is warm.  Neurological:     Mental Status: She is alert and oriented to person, place, and time. Mental status is at baseline.  Psychiatric:  Mood and Affect: Mood normal.     ED Results / Procedures / Treatments   Labs (all labs ordered are listed, but only abnormal results are displayed) Labs Reviewed  BASIC METABOLIC PANEL - Abnormal; Notable for the following components:      Result Value   Calcium 10.7 (*)    All other components within normal limits  CBC - Abnormal; Notable for the following components:   RBC 3.49 (*)    Hemoglobin 11.1 (*)    HCT 34.7 (*)    RDW 15.8 (*)    All other components within normal limits  TROPONIN I (HIGH SENSITIVITY)    EKG EKG Interpretation Date/Time:  Thursday March 16 2023 07:51:26 EDT Ventricular Rate:  69 PR Interval:  165 QRS Duration:  98 QT Interval:  388 QTC Calculation: 416 R Axis:   -11  Text Interpretation: Sinus rhythm no change from previous  Confirmed by Coralee Pesa ([id number]) on 03/16/2023 8:02:36 AM  Radiology No results found.  Procedures Procedures    Medications Ordered in ED Medications  nitroGLYCERIN (NITROSTAT) SL tablet 0.4 mg (0.4 mg Sublingual Not Given 03/16/23 5366)    ED Course/ Medical Decision Making/ A&P                             Medical Decision Making Amount and/or Complexity of Data Reviewed Labs: ordered. Radiology: ordered.  Risk Prescription drug management.   67 year old female presents emergency department with midsternal chest heaviness that started last night before bed.  Currently present at the severity of 2/10.  No associated shortness of breath, back pain, leg swelling.  She does have left upper extremity pain and left-sided face discomfort associated with the heaviness.  Vital signs are stable on arrival.  EKG is sinus rhythm, no ischemic changes.  Chest x-ray shows no acute finding.  Blood work is reassuring, with 2 negative troponins less than 2 and no delta.  Given the duration of her symptoms ongoing over 12 hours with no troponin changes I have low suspicion for acute ACS at this time.  After an additional dose of level of lingual nitroglycerin here symptoms have resolved.  She has never been seen by cardiologist or had a thorough workup.  I discussed with him and the ambulatory referral to cardiology for further workup for what sounds like an episode of angina.  With no shortness of breath, vital sign abnormalities I have lower suspicion for PE.  Doubt dissection given the reassuring physical exam and resolution of symptoms.  Patient at this time appears safe and stable for discharge and close outpatient follow up. Discharge plan and strict return to ED precautions discussed, patient verbalizes understanding and agreement.        Final Clinical Impression(s) / ED Diagnoses Final diagnoses:  None    Rx / DC Orders ED Discharge Orders     None         Rozelle Logan, DO 03/16/23 1232

## 2023-03-16 NOTE — ED Triage Notes (Signed)
Patient arrives with complaints of central chest discomfort (rates pain an 8/10) that started this morning. She also reports numbness in her left arm going into her fingers. Took one of her husbands nitroglycerin and 4-81mg  Asprin tablets.

## 2023-03-16 NOTE — ED Notes (Signed)
EDP at BS 

## 2023-03-16 NOTE — ED Notes (Signed)
Pt alert, NAD, calm, interactive, resps e/u, speaking in clear complete sentences. Up to b/r, steady gait. Denies dizziness, light headedness, sob, nausea, or discomfort.

## 2023-03-16 NOTE — ED Notes (Signed)
Coming back from b/r endorses some light headedness, continues to deny pain or discomfort, nausea or sob.

## 2023-03-16 NOTE — Discharge Instructions (Signed)
You have been seen and discharged from the emergency department.  Your cardiac workup was normal.  However you need to establish care with cardiology for more in-depth workup for anginal symptoms.  Follow-up with your primary provider for further evaluation and further care. Take home medications as prescribed. If you have any worsening symptoms, return of chest heaviness, difficulty breathing, severe back pain or further concerns for your health please return to an emergency department for further evaluation.

## 2023-03-16 NOTE — ED Notes (Signed)
Up to b/r, steady gait, alert, NAD, calm, resps e/u

## 2023-03-16 NOTE — ED Notes (Signed)
Patient transported to X-ray 

## 2024-01-31 ENCOUNTER — Emergency Department (HOSPITAL_BASED_OUTPATIENT_CLINIC_OR_DEPARTMENT_OTHER)
Admission: EM | Admit: 2024-01-31 | Discharge: 2024-01-31 | Disposition: A | Discharge status: Home / Self Care | Attending: Emergency Medicine | Admitting: Emergency Medicine

## 2024-01-31 ENCOUNTER — Emergency Department (HOSPITAL_BASED_OUTPATIENT_CLINIC_OR_DEPARTMENT_OTHER)

## 2024-01-31 ENCOUNTER — Other Ambulatory Visit (HOSPITAL_BASED_OUTPATIENT_CLINIC_OR_DEPARTMENT_OTHER): Payer: Self-pay

## 2024-01-31 ENCOUNTER — Encounter (HOSPITAL_BASED_OUTPATIENT_CLINIC_OR_DEPARTMENT_OTHER): Payer: Self-pay

## 2024-01-31 ENCOUNTER — Other Ambulatory Visit: Payer: Self-pay

## 2024-01-31 DIAGNOSIS — R42 Dizziness and giddiness: Secondary | ICD-10-CM | POA: Insufficient documentation

## 2024-01-31 DIAGNOSIS — Z7982 Long term (current) use of aspirin: Secondary | ICD-10-CM | POA: Insufficient documentation

## 2024-01-31 LAB — URINALYSIS, ROUTINE W REFLEX MICROSCOPIC
Bilirubin Urine: NEGATIVE
Glucose, UA: NEGATIVE mg/dL
Hgb urine dipstick: NEGATIVE
Ketones, ur: NEGATIVE mg/dL
Leukocytes,Ua: NEGATIVE
Nitrite: NEGATIVE
Protein, ur: NEGATIVE mg/dL
Specific Gravity, Urine: <1.005 — ABNORMAL LOW (ref 1.005–1.030)
pH: 6.5 (ref 5.0–8.0)

## 2024-01-31 LAB — CBC
HCT: 34.6 % — ABNORMAL LOW (ref 36.0–46.0)
Hemoglobin: 11.2 g/dL — ABNORMAL LOW (ref 12.0–15.0)
MCH: 32.1 pg (ref 26.0–34.0)
MCHC: 32.4 g/dL (ref 30.0–36.0)
MCV: 99.1 fL (ref 80.0–100.0)
Platelets: 330 10*3/uL (ref 150–400)
RBC: 3.49 MIL/uL — ABNORMAL LOW (ref 3.87–5.11)
RDW: 16 % — ABNORMAL HIGH (ref 11.5–15.5)
WBC: 5.8 10*3/uL (ref 4.0–10.5)
nRBC: 0 % (ref 0.0–0.2)

## 2024-01-31 LAB — COMPREHENSIVE METABOLIC PANEL WITH GFR
ALT: 12 U/L (ref 0–44)
AST: 16 U/L (ref 15–41)
Albumin: 4.3 g/dL (ref 3.5–5.0)
Alkaline Phosphatase: 72 U/L (ref 38–126)
Anion gap: 10 (ref 5–15)
BUN: 14 mg/dL (ref 8–23)
CO2: 25 mmol/L (ref 22–32)
Calcium: 11.1 mg/dL — ABNORMAL HIGH (ref 8.9–10.3)
Chloride: 103 mmol/L (ref 98–111)
Creatinine, Ser: 0.61 mg/dL (ref 0.44–1.00)
GFR, Estimated: >60 mL/min (ref >60)
Glucose, Bld: 104 mg/dL — ABNORMAL HIGH (ref 70–99)
Potassium: 3.4 mmol/L — ABNORMAL LOW (ref 3.5–5.1)
Sodium: 139 mmol/L (ref 135–145)
Total Bilirubin: 0.4 mg/dL (ref 0.0–1.2)
Total Protein: 6.8 g/dL (ref 6.5–8.1)

## 2024-01-31 LAB — CBG MONITORING, ED: Glucose-Capillary: 100 mg/dL — ABNORMAL HIGH (ref 70–99)

## 2024-01-31 MED ORDER — METOCLOPRAMIDE HCL 5 MG/ML IJ SOLN
10.0000 mg | Freq: Once | INTRAMUSCULAR | Status: AC
  Administered 2024-01-31: 10 mg via INTRAVENOUS
  Filled 2024-01-31: qty 2

## 2024-01-31 MED ORDER — ONDANSETRON HCL 4 MG/2ML IJ SOLN
4.0000 mg | Freq: Once | INTRAMUSCULAR | Status: AC
  Administered 2024-01-31: 4 mg via INTRAVENOUS
  Filled 2024-01-31: qty 2

## 2024-01-31 MED ORDER — SODIUM CHLORIDE 0.9 % IV BOLUS
1000.0000 mL | Freq: Once | INTRAVENOUS | Status: AC
  Administered 2024-01-31: 1000 mL via INTRAVENOUS

## 2024-01-31 MED ORDER — MECLIZINE HCL 25 MG PO TABS
25.0000 mg | ORAL_TABLET | Freq: Once | ORAL | Status: AC
  Administered 2024-01-31: 25 mg via ORAL
  Filled 2024-01-31: qty 1

## 2024-01-31 MED ORDER — DIAZEPAM 5 MG/ML IJ SOLN
5.0000 mg | Freq: Once | INTRAMUSCULAR | Status: AC
  Administered 2024-01-31: 5 mg via INTRAVENOUS
  Filled 2024-01-31: qty 2

## 2024-01-31 MED ORDER — MECLIZINE HCL 25 MG PO TABS: 25.0000 mg | ORAL_TABLET | Freq: Three times a day (TID) | ORAL | 0 refills | Status: AC | PRN

## 2024-01-31 NOTE — ED Provider Notes (Signed)
 7:15 AM Care assumed from Dr. Carol Chroman.  At time of transfer of care, patient awaiting results of CT head and reassessment after fluids and medications.  Previous team suspected primarily vertigo however if patient symptoms have not improved or still significant persistent, will have a discussion about possible transfer for MRI today to rule out stroke.  10:28 AM CT head reassuring.  Patient is feeling much better.  She would like to go home.  She does not want to get an MRI today and would rather follow-up with her PCP to help navigate outpatient ENT versus neurology follow-up.  Will give her prescription for meclizine as this seemed to help and patient was discharged for outpatient follow-up.  Patient agrees with plan of care and return precautions and will be discharged.  Clinical Impression: 1. Vertigo   2. Dizziness     Disposition: Discharge  Condition: Good  I have discussed the results, Dx and Tx plan with the pt(& family if present). He/she/they expressed understanding and agree(s) with the plan. Discharge instructions discussed at great length. Strict return precautions discussed and pt &/or family have verbalized understanding of the instructions. No further questions at time of discharge.    New Prescriptions   MECLIZINE (ANTIVERT) 25 MG TABLET    Take 1 tablet (25 mg total) by mouth 3 (three) times daily as needed for dizziness.    Follow Up: your PCP         Senetra Dillin, Marine Sia, MD 01/31/24 1031

## 2024-01-31 NOTE — ED Provider Notes (Signed)
  [place] EMERGENCY DEPARTMENT AT [facility] Provider Note   CSN: [id number] Arrival date & time: 01/31/24  0601     History  Chief Complaint  Patient presents with   Dizziness    Timothy Townsel is a 68 y.o. female.  Patient presents to the emergency department for dizziness and near syncope.  Patient reports a history of vertigo.  She reports 1-2 episodes a year.  She did have an episode 2 months ago.  She reports that usually they last 1 to 2 days and then resolved.  Symptoms are normally not as severe as tonight.  Tonight while she was in the shower she had sudden onset of severe spinning sensation that made her feel like she was going to pass out.  She did not lose consciousness.  Patient reports that she then noticed that her heart was pounding.  No associated chest pain.  At arrival to the ED she reports that she feels better if she holds her head perfectly still but if she turns her head the dizziness worsens.  She does have associated nausea.       Home Medications Prior to Admission medications   Medication Sig Start Date End Date Taking? Authorizing Provider  aspirin  EC 325 MG EC tablet Take 1 tablet (325 mg total) by mouth daily. 04/07/16   Vann, Jessica U, DO  ranitidine (ZANTAC) 150 MG tablet Take 150 mg by mouth 2 (two) times daily as needed for heartburn.    [provider]      Allergies    Patient has no known allergies.    Review of Systems   Review of Systems  Physical Exam Updated Vital Signs BP (!) 157/81   Pulse 80   Temp 98.4 F (36.9 C) (Oral)   Resp 16   SpO2 99%  Physical Exam Vitals and nursing note reviewed.  Constitutional:      General: She is not in acute distress.    Appearance: She is well-developed.  HENT:     Head: Normocephalic and atraumatic.     Mouth/Throat:     Mouth: Mucous membranes are moist.  Eyes:     General: Vision grossly intact. Gaze aligned appropriately.     Extraocular Movements: Extraocular  movements intact.     Conjunctiva/sclera: Conjunctivae normal.  Cardiovascular:     Rate and Rhythm: Normal rate and regular rhythm.     Pulses: Normal pulses.     Heart sounds: Normal heart sounds, S1 normal and S2 normal. No murmur heard.    No friction rub. No gallop.  Pulmonary:     Effort: Pulmonary effort is normal. No respiratory distress.     Breath sounds: Normal breath sounds.  Abdominal:     General: Bowel sounds are normal.     Palpations: Abdomen is soft.     Tenderness: There is no abdominal tenderness. There is no guarding or rebound.     Hernia: No hernia is present.  Musculoskeletal:        General: No swelling.     Cervical back: Full passive range of motion without pain, normal range of motion and neck supple. No spinous process tenderness or muscular tenderness. Normal range of motion.     Right lower leg: No edema.     Left lower leg: No edema.  Skin:    General: Skin is warm and dry.     Capillary Refill: Capillary refill takes less than 2 seconds.     Findings: No ecchymosis,  erythema, rash or wound.  Neurological:     General: No focal deficit present.     Mental Status: She is alert and oriented to person, place, and time.     GCS: GCS eye subscore is 4. GCS verbal subscore is 5. GCS motor subscore is 6.     Cranial Nerves: Cranial nerves 2-12 are intact.     Sensory: Sensation is intact.     Motor: Motor function is intact.     Coordination: Coordination is intact.     Comments: Normal strength and sensation bilateral lower extremities, equal; normal finger-to-nose bilaterally.  Normal heel-to-shin bilaterally.  Psychiatric:        Attention and Perception: Attention normal.        Mood and Affect: Mood normal.        Speech: Speech normal.        Behavior: Behavior normal.     ED Results / Procedures / Treatments   Labs (all labs ordered are listed, but only abnormal results are displayed) Labs Reviewed  CBC - Abnormal; Notable for the following  components:      Result Value   RBC 3.49 (*)    Hemoglobin 11.2 (*)    HCT 34.6 (*)    RDW 16.0 (*)    All other components within normal limits  CBG MONITORING, ED - Abnormal; Notable for the following components:   Glucose-Capillary 100 (*)    All other components within normal limits  COMPREHENSIVE METABOLIC PANEL WITH GFR  URINALYSIS, ROUTINE W REFLEX MICROSCOPIC    EKG EKG Interpretation Date/Time:  Wednesday Jan 31 2024 06:11:17 EDT Ventricular Rate:  79 PR Interval:  173 QRS Duration:  97 QT Interval:  376 QTC Calculation: 431 R Axis:   -5  Text Interpretation: Sinus rhythm Normal ECG Confirmed by Ballard Bongo [phone]) on 01/31/2024 6:12:57 AM  Radiology No results found.  Procedures Procedures    Medications Ordered in ED Medications  ondansetron  (ZOFRAN ) injection 4 mg (has no administration in time range)  metoCLOPramide (REGLAN) injection 10 mg (has no administration in time range)  sodium chloride 0.9 % bolus 1,000 mL (1,000 mLs Intravenous New Bag/Given 01/31/24 8295)  meclizine (ANTIVERT) tablet 25 mg (25 mg Oral Given 01/31/24 0630)  diazepam (VALIUM) injection 5 mg (5 mg Intravenous Given 01/31/24 0630)    ED Course/ Medical Decision Making/ A&P                                 Medical Decision Making Amount and/or Complexity of Data Reviewed External Data Reviewed: labs and ECG. Labs: ordered. Decision-making details documented in ED Course. Radiology: ordered. ECG/medicine tests: ordered and independent interpretation performed. Decision-making details documented in ED Course.  Risk Prescription drug management.   Differential diagnosis considered includes, but not limited to:  Peripheral vertigo; antral vertigo including intracranial hemorrhage and ischemic stroke  Patient presents to the emergency department with sudden onset of severe vertigo symptoms causing near syncope.  She does have a history of recurrent vertigo.  She normally  does not treat her vertigo.  Symptoms tonight were more severe than she normally gets.  Patient is comfortable when sitting still and not moving her head.  Symptoms are exacerbated by head movements.  She does not have any abnormal cerebellar signs on exam.  Patient reports rapid heart rate earlier which was likely secondary to the near syncopal episode.  EKG is normal sinus  rhythm around 80 bpm with no abnormalities.  She has not experienced chest pain and did not have any chest pain earlier.  She did take her husbands nitroglycerin  and aspirin  which likely exacerbated her near syncope symptoms.  Patient will undergo CT head, basic labs.  She will receive empiric peripheral vertigo treatment and then be reevaluated.  Will sign out to oncoming ER physician to follow results.        Final Clinical Impression(s) / ED Diagnoses Final diagnoses:  Vertigo    Rx / DC Orders ED Discharge Orders     None         Jennavecia Schwier, Marine Sia, MD 01/31/24 [phone]

## 2024-01-31 NOTE — ED Notes (Signed)
 Dc instructions reviewed with patient. Patient voiced understanding. Dc with belongings.

## 2024-01-31 NOTE — ED Triage Notes (Signed)
 Became dizzy while in the shower this morning. Since then has felt heart palpitations. Took husbands nitroglycerin  and baby aspirin . Reports HR was elevated at home.

## 2024-01-31 NOTE — Discharge Instructions (Signed)
 Your history, exam, and workup today seem consistent with recurrent vertigo and vertiginous dizziness.  Your CT scan was completely reassuring.  After medications and fluids your symptoms improved drastically and we had a shared decision-making conversation about management.  We agreed with plan for discharge to follow-up with your primary doctor to help navigate seeing either ENT or neurology as an outpatient and we did offer transfer for MRI today but given your improvement in symptoms we felt this was reasonable to hold on this.  If symptoms are to change or worsen acutely, we recommend going to the nearest emergency department.  Please rest and stay hydrated.
